# Patient Record
Sex: Female | Born: 1985 | Race: Black or African American | Hispanic: No | Smoking: Never smoker
Health system: Southern US, Community
[De-identification: ages and names within clinical notes are randomized; demographics above are authoritative.]

## PROBLEM LIST (undated history)

## (undated) DIAGNOSIS — R42 Dizziness and giddiness: Secondary | ICD-10-CM

---

## 2010-05-28 ENCOUNTER — Emergency Department (HOSPITAL_COMMUNITY): Admission: EM | Admit: 2010-05-28 | Discharge: 2010-05-28 | Payer: Self-pay | Admitting: Emergency Medicine

## 2015-08-20 ENCOUNTER — Emergency Department: Payer: BLUE CROSS/BLUE SHIELD

## 2015-08-20 ENCOUNTER — Emergency Department
Admission: EM | Admit: 2015-08-20 | Discharge: 2015-08-20 | Disposition: A | Discharge status: Home / Self Care | Payer: BLUE CROSS/BLUE SHIELD | Attending: Emergency Medicine | Admitting: Emergency Medicine

## 2015-08-20 ENCOUNTER — Encounter: Payer: Self-pay | Admitting: Medical Oncology

## 2015-08-20 DIAGNOSIS — Z3A01 Less than 8 weeks gestation of pregnancy: Secondary | ICD-10-CM | POA: Diagnosis not present

## 2015-08-20 DIAGNOSIS — O9989 Other specified diseases and conditions complicating pregnancy, childbirth and the puerperium: Secondary | ICD-10-CM | POA: Diagnosis present

## 2015-08-20 DIAGNOSIS — Z3490 Encounter for supervision of normal pregnancy, unspecified, unspecified trimester: Secondary | ICD-10-CM

## 2015-08-20 DIAGNOSIS — N760 Acute vaginitis: Secondary | ICD-10-CM

## 2015-08-20 DIAGNOSIS — M545 Low back pain, unspecified: Secondary | ICD-10-CM

## 2015-08-20 DIAGNOSIS — O23591 Infection of other part of genital tract in pregnancy, first trimester: Secondary | ICD-10-CM | POA: Insufficient documentation

## 2015-08-20 DIAGNOSIS — B9689 Other specified bacterial agents as the cause of diseases classified elsewhere: Secondary | ICD-10-CM

## 2015-08-20 DIAGNOSIS — R102 Pelvic and perineal pain: Secondary | ICD-10-CM | POA: Diagnosis not present

## 2015-08-20 HISTORY — DX: Dizziness and giddiness: R42

## 2015-08-20 LAB — CBC
HEMATOCRIT: 35.3 % (ref 35.0–47.0)
Hemoglobin: 11.9 g/dL — ABNORMAL LOW (ref 12.0–16.0)
MCH: 29.7 pg (ref 26.0–34.0)
MCHC: 33.8 g/dL (ref 32.0–36.0)
MCV: 87.9 fL (ref 80.0–100.0)
PLATELETS: 336 10*3/uL (ref 150–440)
RBC: 4.01 MIL/uL (ref 3.80–5.20)
RDW: 13.2 % (ref 11.5–14.5)
WBC: 13.9 10*3/uL — AB (ref 3.6–11.0)

## 2015-08-20 LAB — URINALYSIS COMPLETE WITH MICROSCOPIC (ARMC ONLY)
BILIRUBIN URINE: NEGATIVE
Bacteria, UA: NONE SEEN
GLUCOSE, UA: NEGATIVE mg/dL
Hgb urine dipstick: NEGATIVE
KETONES UR: NEGATIVE mg/dL
Leukocytes, UA: NEGATIVE
Nitrite: NEGATIVE
PROTEIN: NEGATIVE mg/dL
Specific Gravity, Urine: 1.01 (ref 1.005–1.030)
pH: 7 (ref 5.0–8.0)

## 2015-08-20 LAB — WET PREP, GENITAL
Sperm: NONE SEEN
Trich, Wet Prep: NONE SEEN
Yeast Wet Prep HPF POC: NONE SEEN

## 2015-08-20 LAB — HCG, QUANTITATIVE, PREGNANCY: HCG, BETA CHAIN, QUANT, S: 4351 m[IU]/mL — AB (ref <5)

## 2015-08-20 LAB — LIPASE, BLOOD: LIPASE: 31 U/L (ref 11–51)

## 2015-08-20 LAB — COMPREHENSIVE METABOLIC PANEL
ALT: 20 U/L (ref 14–54)
AST: 20 U/L (ref 15–41)
Albumin: 4 g/dL (ref 3.5–5.0)
Alkaline Phosphatase: 53 U/L (ref 38–126)
Anion gap: 8 (ref 5–15)
BUN: 9 mg/dL (ref 6–20)
CHLORIDE: 105 mmol/L (ref 101–111)
CO2: 24 mmol/L (ref 22–32)
CREATININE: 0.83 mg/dL (ref 0.44–1.00)
Calcium: 9.3 mg/dL (ref 8.9–10.3)
Glucose, Bld: 105 mg/dL — ABNORMAL HIGH (ref 65–99)
POTASSIUM: 3.6 mmol/L (ref 3.5–5.1)
SODIUM: 137 mmol/L (ref 135–145)
Total Bilirubin: 0.4 mg/dL (ref 0.3–1.2)
Total Protein: 7.3 g/dL (ref 6.5–8.1)

## 2015-08-20 LAB — CHLAMYDIA/NGC RT PCR (ARMC ONLY)
CHLAMYDIA TR: NOT DETECTED
N gonorrhoeae: NOT DETECTED

## 2015-08-20 MED ORDER — ACETAMINOPHEN 325 MG PO TABS
650.0000 mg | ORAL_TABLET | Freq: Once | ORAL | Status: AC
  Administered 2015-08-20: 650 mg via ORAL

## 2015-08-20 MED ORDER — METRONIDAZOLE 500 MG PO TABS: 500.0000 mg | ORAL_TABLET | Freq: Two times a day (BID) | ORAL | Status: AC

## 2015-08-20 NOTE — Discharge Instructions (Signed)
Please take the entire course of Flagyl, even if you're feeling better. You may take Tylenol for pain.   Please make an appointment with your OB/GYN for Monday or Tuesday of next week. Please return to the emergency department if you develop severe pain, vaginal bleeding, lightheadedness or fainting, fever, inability to keep down fluids, or any other symptoms concerning to you.  Bacterial Vaginosis Bacterial vaginosis is an infection of the vagina. It happens when too many germs (bacteria) grow in the vagina. Having this infection puts you at risk for getting other infections from sex. Treating this infection can help lower your risk for other infections, such as:   Chlamydia.  Gonorrhea.  HIV.  Herpes. HOME CARE  Take your medicine as told by your doctor.  Finish your medicine even if you start to feel better.  Tell your sex partner that you have an infection. They should see their doctor for treatment.  During treatment:  Avoid sex or use condoms correctly.  Do not douche.  Do not drink alcohol unless your doctor tells you it is ok.  Do not breastfeed unless your doctor tells you it is ok. GET HELP IF:  You are not getting better after 3 days of treatment.  You have more grey fluid (discharge) coming from your vagina than before.  You have more pain than before.  You have a fever. MAKE SURE YOU:   Understand these instructions.  Will watch your condition.  Will get help right away if you are not doing well or get worse.   This information is not intended to replace advice given to you by your health care provider. Make sure you discuss any questions you have with your health care provider.   Document Released: 06/13/2008 Document Revised: 09/25/2014 Document Reviewed: 04/16/2013 Elsevier Interactive Patient Education [employer] Inc.

## 2015-08-20 NOTE — ED Notes (Signed)
Pt reports that she began having lower back pain and lower abd cramping today. Pt reports she is approx [redacted] weeks pregnant. Denies vaginal bleeding.

## 2015-08-20 NOTE — ED Provider Notes (Addendum)
[facility] Emergency Department Provider Note  ____________________________________________  Time seen: Approximately 8:12 PM  I have reviewed the triage vital signs and the nursing notes.   HISTORY  Chief Complaint Abdominal Cramping and Back Pain    HPI Lori Nash is a 29 y.o. female G2 P1 approximately [redacted] weeks pregnant by LMP presenting with low back discomfort and pelvic pain. Patient states that earlier today she started having some cramping sensation in her low back with suprapubic pain. She has had urinary frequency but no pain with urination. She denies any vaginal bleeding but has had a change in her vaginal discharge. She denies any fever, chills, diarrhea. She has had some nausea without vomiting. She has not yet established OB care as she is early in the pregnancy.   Past Medical History  Diagnosis Date  . Vertigo     There are no active problems to display for this patient.   Past Surgical History  Procedure Laterality Date  . Cesarean section      Current Outpatient Rx  Name  Route  Sig  Dispense  Refill  . metroNIDAZOLE (FLAGYL) 500 MG tablet   Oral   Take 1 tablet (500 mg total) by mouth 2 (two) times daily.   14 tablet   0     Allergies Review of patient's allergies indicates no known allergies.  No family history on file.  Social History Social History  Substance Use Topics  . Smoking status: Never Smoker   . Smokeless tobacco: None  . Alcohol Use: No    Review of Systems Constitutional: No fever/chills. No lightheadedness or syncope. Eyes: No visual changes. ENT: No sore throat. Cardiovascular: Denies chest pain, palpitations. Respiratory: Denies shortness of breath.  No cough. Gastrointestinal: No abdominal pain.  Positive low back pain and pelvic pain. No nausea, no vomiting.  No diarrhea.  No constipation. Genitourinary: Negative for dysuria. No vaginal bleeding. Positive for changes in vaginal  discharge. Musculoskeletal: Negative for back pain. Skin: Negative for rash. Neurological: Negative for headaches, focal weakness or numbness.  10-point ROS otherwise negative.  ____________________________________________   PHYSICAL EXAM:  VITAL SIGNS: ED Triage Vitals  Enc Vitals Group     BP 08/20/15 1856 133/76 mmHg     Pulse Rate 08/20/15 1856 90     Resp 08/20/15 1856 18     Temp 08/20/15 1856 98.4 F (36.9 C)     Temp Source 08/20/15 1856 Oral     SpO2 08/20/15 1856 100 %     Weight 08/20/15 1856 270 lb (122.471 kg)     Height 08/20/15 1856  (1.727 m)     Head Cir --      Peak Flow --      Pain Score 08/20/15 1857 4     Pain Loc --      Pain Edu? --      Excl. in GC? --     Constitutional: Alert and oriented. Well appearing and in no acute distress. Answer question appropriately. Lying comfortably in the stretcher. Eyes: Conjunctivae are normal.  EOMI. Head: Atraumatic. Nose: No congestion/rhinnorhea. Mouth/Throat: Mucous membranes are moist.  Neck: No stridor.  Supple.   Cardiovascular: Normal rate, regular rhythm. No murmurs, rubs or gallops.  Respiratory: Normal respiratory effort.  No retractions. Lungs CTAB.  No wheezes, rales or ronchi. Gastrointestinal: Abdomen is soft and nondistended. Patient has minimal diffuse tenderness in the lower abdomen without focality. No palpable uterus. No guarding or rebound, no peritoneal  signs, negative Murphy sign. Genitourinary: Normal-appearing external genitalia without lesions. Speculum exam reveals a mild amount of thick white vaginal discharge with a normal-appearing cervix. Bimanual exam reveals no CMT, closed cervix, no adnexal masses or tenderness, "pressure" over the suprapubic area and no palpable uterine fundus. Exam may be limited due to patient's large body habitus. Musculoskeletal: No LE edema.  Neurologic:  Normal speech and language. No gross focal neurologic deficits are appreciated.  Skin:  Skin is  warm, dry and intact. No rash noted. Psychiatric: Mood and affect are normal. Speech and behavior are normal.  Normal judgement.  ____________________________________________   LABS (all labs ordered are listed, but only abnormal results are displayed)  Labs Reviewed  WET PREP, GENITAL - Abnormal; Notable for the following:    Clue Cells Wet Prep HPF POC FEW (*)    WBC, Wet Prep HPF POC FEW (*)    All other components within normal limits  COMPREHENSIVE METABOLIC PANEL - Abnormal; Notable for the following:    Glucose, Bld 105 (*)    All other components within normal limits  CBC - Abnormal; Notable for the following:    WBC 13.9 (*)    Hemoglobin 11.9 (*)    All other components within normal limits  HCG, QUANTITATIVE, PREGNANCY - Abnormal; Notable for the following:    hCG, Beta Chain, Quant, S 4351 (*)    All other components within normal limits  URINALYSIS COMPLETEWITH MICROSCOPIC ([facility] ONLY) - Abnormal; Notable for the following:    Color, Urine YELLOW (*)    APPearance CLEAR (*)    Squamous Epithelial / LPF 0-5 (*)    All other components within normal limits  CHLAMYDIA/NGC RT PCR ([facility] ONLY)  LIPASE, BLOOD   ____________________________________________  EKG  Not indicated ____________________________________________  RADIOLOGY  US Ob Comp Less 14 Wks  08/20/2015  CLINICAL DATA:  Lower abdominal cramping, onset today. EXAM: OBSTETRIC <14 WK [place] AND TRANSVAGINAL OB US TECHNIQUE: Both transabdominal and transvaginal ultrasound examinations were performed for complete evaluation of the gestation as well as the maternal uterus, adnexal regions, and pelvic cul-de-sac. Transvaginal technique was performed to assess early pregnancy. COMPARISON:  None. FINDINGS: Intrauterine gestational sac: Single Yolk sac:  Yes Embryo:  No Cardiac Activity: No Heart Rate:   bpm MSD: 7  mm   5 w   3  d CRL:    mm    w    d                  [place] EDC: Maternal uterus/adnexae: Normal  IMPRESSION: Early intrauterine gestational sac with visible yolk sac, but no fetal pole or cardiac activity yet visualized. Recommend follow-up quantitative B-HCG levels and follow-up US in 14 days to confirm and assess viability. This recommendation follows SRU consensus guidelines: Diagnostic Criteria for Nonviable Pregnancy Early in the First Trimester. Malva Limes Med 2013; 098:1191-47. Electronically Signed   By: Ellery Plunk M.D.   On: 08/20/2015 22:56   US Ob Transvaginal  08/20/2015  CLINICAL DATA:  Lower abdominal cramping, onset today. EXAM: OBSTETRIC <14 WK [place] AND TRANSVAGINAL OB US TECHNIQUE: Both transabdominal and transvaginal ultrasound examinations were performed for complete evaluation of the gestation as well as the maternal uterus, adnexal regions, and pelvic cul-de-sac. Transvaginal technique was performed to assess early pregnancy. COMPARISON:  None. FINDINGS: Intrauterine gestational sac: Single Yolk sac:  Yes Embryo:  No Cardiac Activity: No Heart Rate:   bpm MSD: 7  mm   5  w   3  d CRL:    mm    w    d                  US EDC: Maternal uterus/adnexae: Normal IMPRESSION: Early intrauterine gestational sac with visible yolk sac, but no fetal pole or cardiac activity yet visualized. Recommend follow-up quantitative B-HCG levels and follow-up US in 14 days to confirm and assess viability. This recommendation follows SRU consensus guidelines: Diagnostic Criteria for Nonviable Pregnancy Early in the First Trimester. Malva Limes Engl J Med 2013; 147:8295-62; 369:1443-51. Electronically Signed   By: Ellery Plunkaniel R Mitchell M.D.   On: 08/20/2015 22:56    ____________________________________________   PROCEDURES  Procedure(s) performed: None  Critical Care performed: No ____________________________________________   INITIAL IMPRESSION / ASSESSMENT AND PLAN / ED COURSE  Pertinent labs & imaging results that were available during my care of the patient were reviewed by me and considered in my medical decision  making (see chart for details).  29 y.o. G2 P1 apparently [redacted] weeks pregnant presenting with low back cramping and suprapubic pain with urinary frequency. We will evaluate her for ectopic pregnancy although at this early stage of pregnancy it is possible we will not have an answer about this today. Given her vaginal discharge and also check for vaginal infection. Consider UTI there is no evidence for appendicitis or an acute surgical pathology at this time.  ----------------------------------------- 11:08 PM on 08/20/2015 -----------------------------------------  The patient's wet prep came back positive for bacterial vaginosis. I have let the patient now in particular prescription for her Flagyl. The patient's ultrasound does show yolk sac but no fetal pole. I'll plan discharge with close OB/GYN follow-up. The patient and her husband understand return precautions as well as follow-up instructions.  NEW MEDICATIONS STARTED DURING THIS VISIT:  New Prescriptions   METRONIDAZOLE (FLAGYL) 500 MG TABLET    Take 1 tablet (500 mg total) by mouth 2 (two) times daily.    ____________________________________________  FINAL CLINICAL IMPRESSION(S) / ED DIAGNOSES  Final diagnoses:  Bacterial vaginosis      NEW MEDICATIONS STARTED DURING THIS VISIT:  New Prescriptions   METRONIDAZOLE (FLAGYL) 500 MG TABLET    Take 1 tablet (500 mg total) by mouth 2 (two) times daily.     Rockne MenghiniAnne-Caroline Fareedah Mahler, MD 08/20/15 [id number]  Rockne MenghiniAnne-Caroline Oluwatoyin Banales, MD 08/20/15 2310

## 2015-09-26 ENCOUNTER — Emergency Department
Admission: EM | Admit: 2015-09-26 | Discharge: 2015-09-26 | Disposition: A | Discharge status: Home / Self Care | Payer: BLUE CROSS/BLUE SHIELD | Attending: Emergency Medicine | Admitting: Emergency Medicine

## 2015-09-26 ENCOUNTER — Emergency Department: Payer: BLUE CROSS/BLUE SHIELD

## 2015-09-26 DIAGNOSIS — O200 Threatened abortion: Secondary | ICD-10-CM

## 2015-09-26 DIAGNOSIS — O2341 Unspecified infection of urinary tract in pregnancy, first trimester: Secondary | ICD-10-CM | POA: Diagnosis not present

## 2015-09-26 DIAGNOSIS — Z3A10 10 weeks gestation of pregnancy: Secondary | ICD-10-CM | POA: Diagnosis not present

## 2015-09-26 DIAGNOSIS — O209 Hemorrhage in early pregnancy, unspecified: Secondary | ICD-10-CM | POA: Diagnosis present

## 2015-09-26 DIAGNOSIS — Z79899 Other long term (current) drug therapy: Secondary | ICD-10-CM | POA: Insufficient documentation

## 2015-09-26 DIAGNOSIS — O039 Complete or unspecified spontaneous abortion without complication: Secondary | ICD-10-CM | POA: Insufficient documentation

## 2015-09-26 LAB — URINALYSIS COMPLETE WITH MICROSCOPIC (ARMC ONLY)
Bilirubin Urine: NEGATIVE
Glucose, UA: NEGATIVE mg/dL
KETONES UR: NEGATIVE mg/dL
Nitrite: NEGATIVE
PROTEIN: 30 mg/dL — AB
SPECIFIC GRAVITY, URINE: 1.023 (ref 1.005–1.030)
pH: 6 (ref 5.0–8.0)

## 2015-09-26 LAB — HCG, QUANTITATIVE, PREGNANCY: HCG, BETA CHAIN, QUANT, S: 216394 m[IU]/mL — AB (ref <5)

## 2015-09-26 LAB — POCT PREGNANCY, URINE: PREG TEST UR: POSITIVE — AB

## 2015-09-26 LAB — ABO/RH: ABO/RH(D): B POS

## 2015-09-26 MED ORDER — NITROFURANTOIN MONOHYD MACRO 100 MG PO CAPS: 100.0000 mg | ORAL_CAPSULE | Freq: Two times a day (BID) | ORAL | Status: AC

## 2015-09-26 NOTE — ED Notes (Signed)
Pt says she got up to use the bathroom this am and noticed strawberry red vaginal bleeding; no clots; pt is [redacted] weeks pregnant with second child; denies abd pain/cramping;

## 2015-09-26 NOTE — ED Provider Notes (Signed)
Patient well-appearing and has no new complaints. Ultrasound unremarkable. Patient [facility]. Return precautions discussed  Jene Everyobert Precious Gilchrest, MD 09/26/15 [phone]

## 2015-09-26 NOTE — ED Notes (Signed)
Pt informed to rest but does not need to be on bedrest. Pt agreeable with plan. Alert and oriented x 4. Ambulatory to lobby. Denies any needs at this time.

## 2015-09-26 NOTE — ED Notes (Signed)
Pt in ultrasound. Visitor sitting in room.

## 2015-09-26 NOTE — ED Provider Notes (Signed)
[facility] Emergency Department Provider Note  ____________________________________________  Time seen: Approximately 5:42 AM  I have reviewed the triage vital signs and the nursing notes.   HISTORY  Chief Complaint Vaginal Bleeding    HPI Lori Nash is a 30 y.o. female G2 P1 at [redacted] weeks gestation followed at [facility].  She presents today after getting up this morning to urinate and when she wiped she had some bright red blood on the tissue paper.  She has had no large volume of bleeding and denies any abdominal pain or cramping.  The onset of the bleeding was acute and the severity is mild.  Nothing is made it worse.  She has no nausea or vomiting.  She has no dysuria.  She has not had any intercourse recently or other pelvic trauma that might have caused the bleeding.  She sees no blood in her urine.  She has no history of complications with this pregnancy and no prior spontaneous or elective abortions.   Past Medical History  Diagnosis Date  . Vertigo     There are no active problems to display for this patient.   Past Surgical History  Procedure Laterality Date  . Cesarean section      Current Outpatient Rx  Name  Route  Sig  Dispense  Refill  . Prenatal Vit-Fe Fumarate-FA (MULTIVITAMIN-PRENATAL) 27-0.8 MG TABS tablet   Oral   Take 1 tablet by mouth daily at 12 noon.         . nitrofurantoin, macrocrystal-monohydrate, (MACROBID) 100 MG capsule   Oral   Take 1 capsule (100 mg total) by mouth 2 (two) times daily.   14 capsule   0     Allergies Review of patient's allergies indicates no known allergies.  No family history on file.  Social History Social History  Substance Use Topics  . Smoking status: Never Smoker   . Smokeless tobacco: Not on file  . Alcohol Use: No    Review of Systems Constitutional: No fever/chills Eyes: No visual changes. ENT: No sore throat. Cardiovascular: Denies chest pain. Respiratory: Denies  shortness of breath. Gastrointestinal: No abdominal pain.  No nausea, no vomiting.  No diarrhea.  No constipation. Genitourinary: Negative for dysuria.  BRB on toilet paper when wiping after urination. Musculoskeletal: Negative for back pain. Skin: Negative for rash. Neurological: Negative for headaches, focal weakness or numbness.  10-point ROS otherwise negative.  ____________________________________________   PHYSICAL EXAM:  VITAL SIGNS: ED Triage Vitals  Enc Vitals Group     BP 09/26/15 0523 125/72 mmHg     Pulse Rate 09/26/15 0523 94     Resp 09/26/15 0523 18     Temp 09/26/15 0523 98.4 F (36.9 C)     Temp Source 09/26/15 0523 Oral     SpO2 09/26/15 0523 98 %     Weight 09/26/15 0523 272 lb (123.378 kg)     Height 09/26/15 0523 5\' 8"  (1.727 m)     Head Cir --      Peak Flow --      Pain Score 09/26/15 0524 0     Pain Loc --      Pain Edu? --      Excl. in GC? --     Constitutional: Alert and oriented. Well appearing and in no acute distress. Eyes: Conjunctivae are normal. PERRL. EOMI. Head: Atraumatic. Nose: No congestion/rhinnorhea. Mouth/Throat: Mucous membranes are moist.  Oropharynx non-erythematous. Neck: No stridor.   Cardiovascular: Normal rate, regular  rhythm. Grossly normal heart sounds.  Good peripheral circulation. Respiratory: Normal respiratory effort.  No retractions. Lungs CTAB. Gastrointestinal: Soft and nontender. No distention. No abdominal bruits. No CVA tenderness. Genitourinary: Deferred Musculoskeletal: No lower extremity tenderness nor edema.  No joint effusions. Neurologic:  Normal speech and language. No gross focal neurologic deficits are appreciated.  Skin:  Skin is warm, dry and intact. No rash noted. Psychiatric: Mood and affect are normal. Speech and behavior are normal.  ____________________________________________   LABS (all labs ordered are listed, but only abnormal results are displayed)  Labs Reviewed  HCG, QUANTITATIVE,  PREGNANCY - Abnormal; Notable for the following:    hCG, Beta Francene FindersChain, Quant, S [id number] (*)    All other components within normal limits  URINALYSIS COMPLETEWITH MICROSCOPIC ([facility] ONLY) - Abnormal; Notable for the following:    Color, Urine YELLOW (*)    APPearance CLOUDY (*)    Hgb urine dipstick 3+ (*)    Protein, ur 30 (*)    Leukocytes, UA 2+ (*)    Bacteria, UA FEW (*)    Squamous Epithelial / LPF TOO NUMEROUS TO COUNT (*)    All other components within normal limits  POCT PREGNANCY, URINE - Abnormal; Notable for the following:    Preg Test, Ur POSITIVE (*)    All other components within normal limits  URINE CULTURE  ABO/RH   ____________________________________________  EKG  Not indicated ____________________________________________  RADIOLOGY   No results found.  ____________________________________________   PROCEDURES  Procedure(s) performed: None  Critical Care performed: No ____________________________________________   INITIAL IMPRESSION / ASSESSMENT AND PLAN / ED COURSE  Pertinent labs & imaging results that were available during my care of the patient were reviewed by me and considered in my medical decision making (see chart for details).  The patient had a very small amount of blood on the tissue paper which I will workup as threatened abortion.  I explained to her that there is very little benefit at this moment to performing a pelvic exam and that an ultrasound will likely revealed information we need.  She agrees with the plan to defer the pelvic exam at this time.  ABO/Rh is pending as is urinalysis.  ----------------------------------------- 6:36 AM on 09/26/2015 -----------------------------------------  UA notable for heavy squamous epithelials, 2+ leukocytes, WBC 6-30.  I suspect contamination, but will send urine culture and treat empirically since the patient is pregnant.  HCG still pending, then will obtain TVUS.  ABO/Rh = B+, does not  need Rhogam.  ----------------------------------------- 7:06 AM on 09/26/2015 -----------------------------------------  Transferring care to Dr. Cyril LoosenKinner to follow up U/S and dispo appropriately.   ____________________________________________  FINAL CLINICAL IMPRESSION(S) / ED DIAGNOSES  Final diagnoses:  Threatened miscarriage in early pregnancy  UTI (urinary tract infection) during pregnancy, first trimester      NEW MEDICATIONS STARTED DURING THIS VISIT:  New Prescriptions   NITROFURANTOIN, MACROCRYSTAL-MONOHYDRATE, (MACROBID) 100 MG CAPSULE    Take 1 capsule (100 mg total) by mouth 2 (two) times daily.     Modified Medications   No medications on file     Discontinued Medications   No medications on file     Loleta Roseory Chane Cowden, MD 09/26/15 [phone]

## 2015-09-26 NOTE — ED Notes (Signed)
Patient reports having scant red blood this morning when she wiped following using the bathroom this morning.  She reports she has had some pains in the last few days in her RUQ that feel like cramps that are localized to the area and she rates her pain 4/10.  She reports the blood was not clumpy.  She reports she has increased her water intake recently.

## 2015-09-26 NOTE — Discharge Instructions (Signed)
You have been seen in the Emergency Department (ED) for vaginal bleeding during pregnancy, which is called a threatened abortion.  Fortunately, your evaluation was reassuring, and the ultrasound showed a normal pregnancy.  Please start taking prenatal vitamins (over the counter) if you are not already doing so.  As a result of your blood type, you did not receive an injection of medication called Rhogam - please let your OB/Gyn know.  Please follow up as recommended above.  Additionally, we sent your urine for a culture and are treating you for a mild urinary tract infection.  Please take the full course of antibiotics prescribed.  You may receive a call from the lab if you need a different kind of antibiotic based on the culture that grows out over the next several days.  If you develop any other symptoms that concern you (including, but not limited to, persistent vomiting, worsening bleeding, abdominal or pelvic pain, or fever greater than 101), please return immediately to the Emergency Department.   Vaginal Bleeding During Pregnancy, First Trimester A small amount of bleeding (spotting) from the vagina is relatively common in early pregnancy. It usually stops on its own. Various things may cause bleeding or spotting in early pregnancy. Some bleeding may be related to the pregnancy, and some may not. In most cases, the bleeding is normal and is not a problem. However, bleeding can also be a sign of something serious. Be sure to tell your health care provider about any vaginal bleeding right away. Some possible causes of vaginal bleeding during the first trimester include:  Infection or inflammation of the cervix.  Growths (polyps) on the cervix.  Miscarriage or threatened miscarriage.  Pregnancy tissue has developed outside of the uterus and in a fallopian tube (tubal pregnancy).  Tiny cysts have developed in the uterus instead of pregnancy tissue (molar pregnancy). HOME CARE INSTRUCTIONS    Watch your condition for any changes. The following actions may help to lessen any discomfort you are feeling:  Follow your health care provider's instructions for limiting your activity. If your health care provider orders bed rest, you may need to stay in bed and only get up to use the bathroom. However, your health care provider may allow you to continue light activity.  If needed, make plans for someone to help with your regular activities and responsibilities while you are on bed rest.  Keep track of the number of pads you use each day, how often you change pads, and how soaked (saturated) they are. Write this down.  Do not use tampons. Do not douche.  Do not have sexual intercourse or orgasms until approved by your health care provider.  If you pass any tissue from your vagina, save the tissue so you can show it to your health care provider.  Only take over-the-counter or prescription medicines as directed by your health care provider.  Do not take aspirin because it can make you bleed.  Keep all follow-up appointments as directed by your health care provider. SEEK MEDICAL CARE IF:  You have any vaginal bleeding during any part of your pregnancy.  You have cramps or labor pains.  You have a fever, not controlled by medicine. SEEK IMMEDIATE MEDICAL CARE IF:   You have severe cramps in your back or belly (abdomen).  You pass large clots or tissue from your vagina.  Your bleeding increases.  You feel light-headed or weak, or you have fainting episodes.  You have chills.  You are leaking fluid  or have a gush of fluid from your vagina.  You pass out while having a bowel movement. MAKE SURE YOU:  Understand these instructions.  Will watch your condition.  Will get help right away if you are not doing well or get worse.   This information is not intended to replace advice given to you by your health care provider. Make sure you discuss any questions you have with  your health care provider.   Document Released: 06/14/2005 Document Revised: 09/09/2013 Document Reviewed: 05/12/2013 Elsevier Interactive Patient Education 2016 [employer]   Urinary Tract Infection Urinary tract infections (UTIs) can develop anywhere along your urinary tract. Your urinary tract is your body's drainage system for removing wastes and extra water. Your urinary tract includes two kidneys, two ureters, a bladder, and a urethra. Your kidneys are a pair of bean-shaped organs. Each kidney is about the size of your fist. They are located below your ribs, one on each side of your spine. CAUSES Infections are caused by microbes, which are microscopic organisms, including fungi, viruses, and bacteria. These organisms are so small that they can only be seen through a microscope. Bacteria are the microbes that most commonly cause UTIs. SYMPTOMS  Symptoms of UTIs may vary by age and gender of the patient and by the location of the infection. Symptoms in young women typically include a frequent and intense urge to urinate and a painful, burning feeling in the bladder or urethra during urination. Older women and men are more likely to be tired, shaky, and weak and have muscle aches and abdominal pain. A fever may mean the infection is in your kidneys. Other symptoms of a kidney infection include pain in your back or sides below the ribs, nausea, and vomiting. DIAGNOSIS To diagnose a UTI, your caregiver will ask you about your symptoms. Your caregiver will also ask you to provide a urine sample. The urine sample will be tested for bacteria and white blood cells. White blood cells are made by your body to help fight infection. TREATMENT  Typically, UTIs can be treated with medication. Because most UTIs are caused by a bacterial infection, they usually can be treated with the use of antibiotics. The choice of antibiotic and length of treatment depend on your symptoms and the type of bacteria causing  your infection. HOME CARE INSTRUCTIONS  If you were prescribed antibiotics, take them exactly as your caregiver instructs you. Finish the medication even if you feel better after you have only taken some of the medication.  Drink enough water and fluids to keep your urine clear or pale yellow.  Avoid caffeine, tea, and carbonated beverages. They tend to irritate your bladder.  Empty your bladder often. Avoid holding urine for long periods of time.  Empty your bladder before and after sexual intercourse.  After a bowel movement, women should cleanse from front to back. Use each tissue only once. SEEK MEDICAL CARE IF:   You have back pain.  You develop a fever.  Your symptoms do not begin to resolve within 3 days. SEEK IMMEDIATE MEDICAL CARE IF:   You have severe back pain or lower abdominal pain.  You develop chills.  You have nausea or vomiting.  You have continued burning or discomfort with urination. MAKE SURE YOU:   Understand these instructions.  Will watch your condition.  Will get help right away if you are not doing well or get worse.   This information is not intended to replace advice given to you  by your health care provider. Make sure you discuss any questions you have with your health care provider.   Document Released: 06/14/2005 Document Revised: 05/26/2015 Document Reviewed: 10/13/2011 Elsevier Interactive Patient Education [employer].

## 2015-09-27 LAB — URINE CULTURE: SPECIAL REQUESTS: NORMAL

## 2016-09-07 IMAGING — US US OB COMP LESS 14 WK
1 series · 14 of 28 positions shown · non-contrast
Comparison: None.

CLINICAL DATA: Lower abdominal cramping, onset today.

EXAM:
OBSTETRIC <14 WK US AND TRANSVAGINAL OB US
TECHNIQUE: Both transabdominal and transvaginal ultrasound examinations were
performed for complete evaluation of the gestation as well as the
maternal uterus, adnexal regions, and pelvic cul-de-sac.
Transvaginal technique was performed to assess early pregnancy.

[Series 1: us ob comp less 14 wk · 0.27mm/px · 14 of 76 slices shown]
[im 3/76]
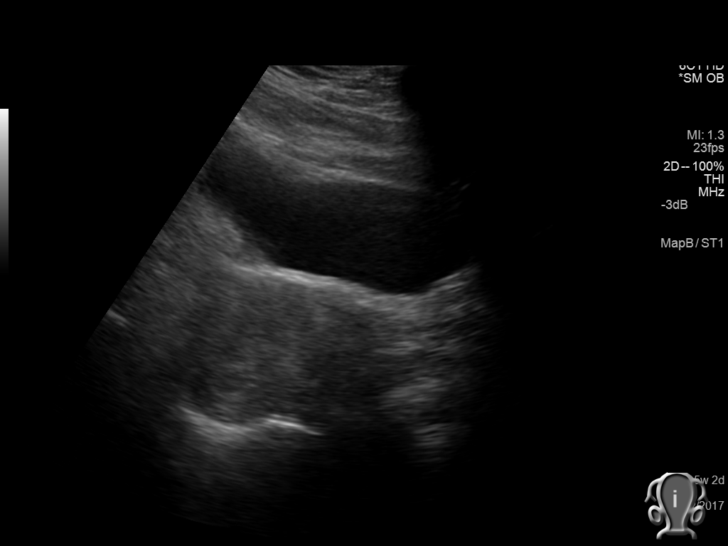
[im 9/76]
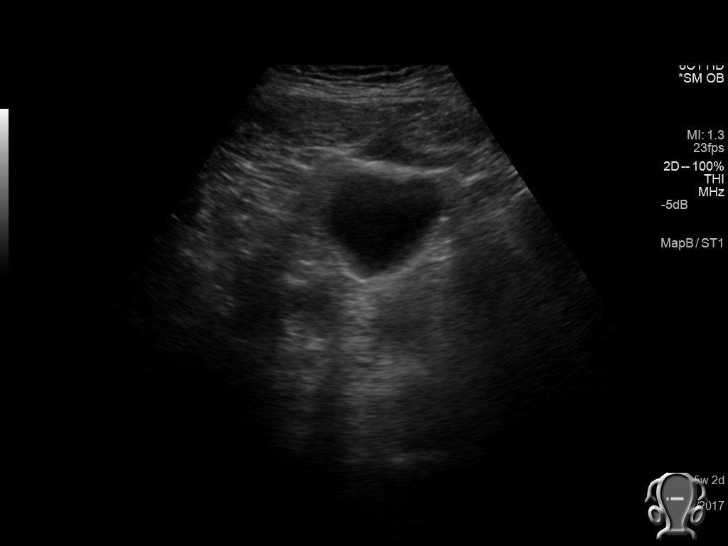
[im 14/76]
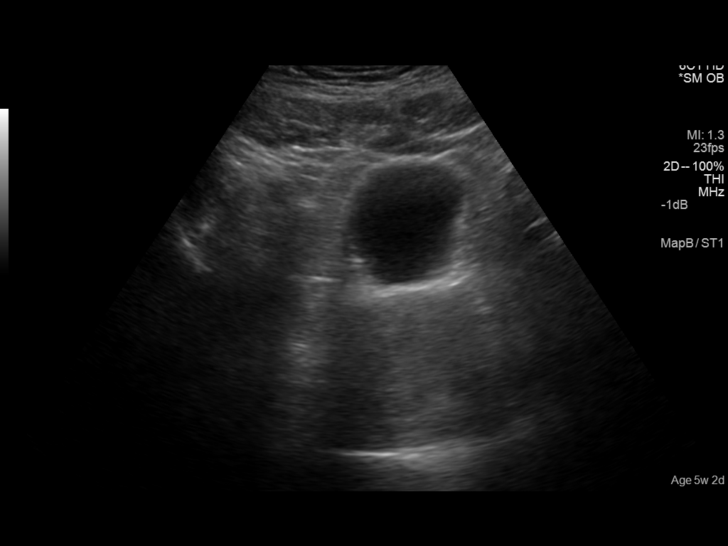
[im 20/76]
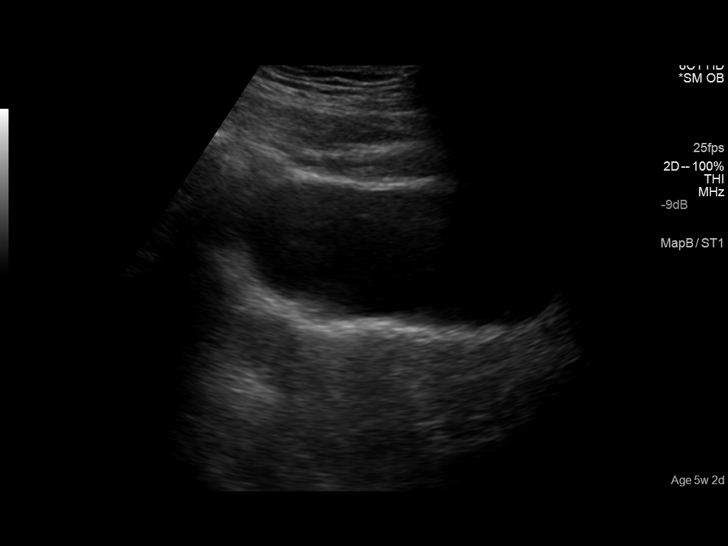
[im 26/76]
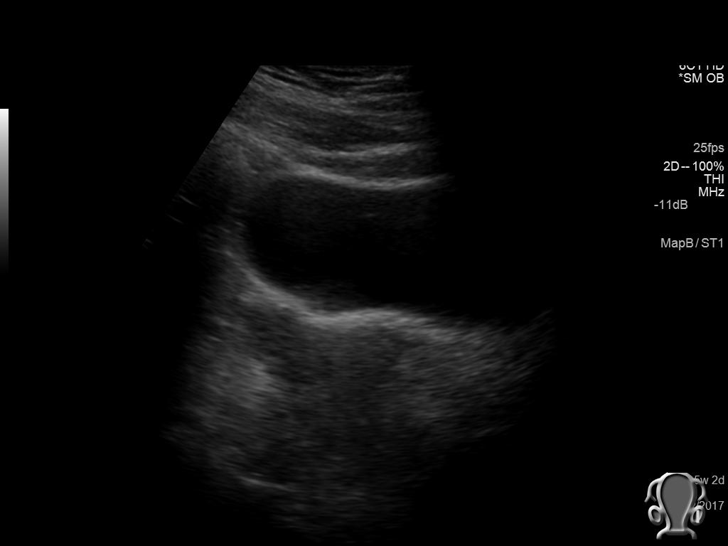
[im 31/76]
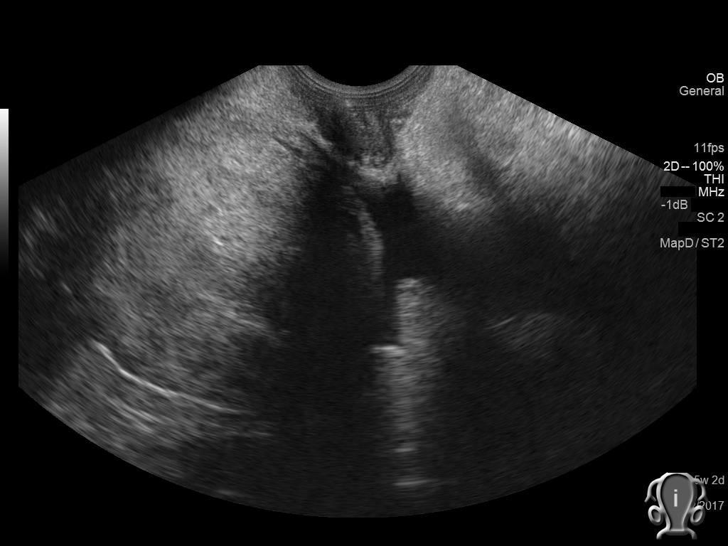
[im 37/76]
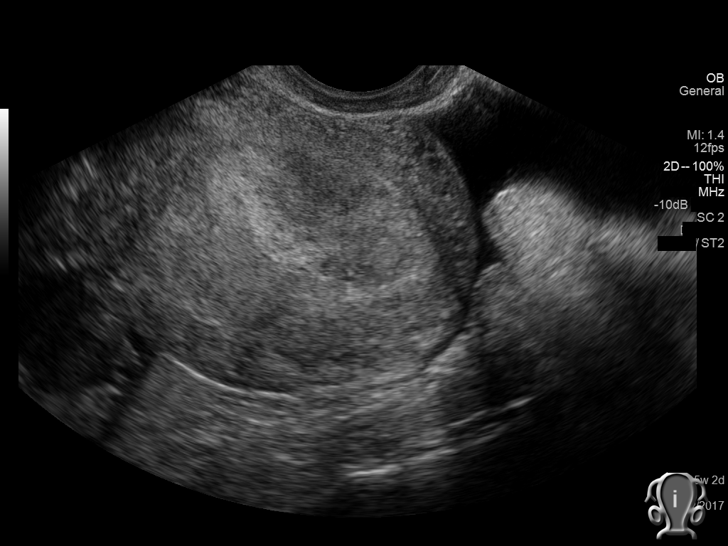
[im 42/76]
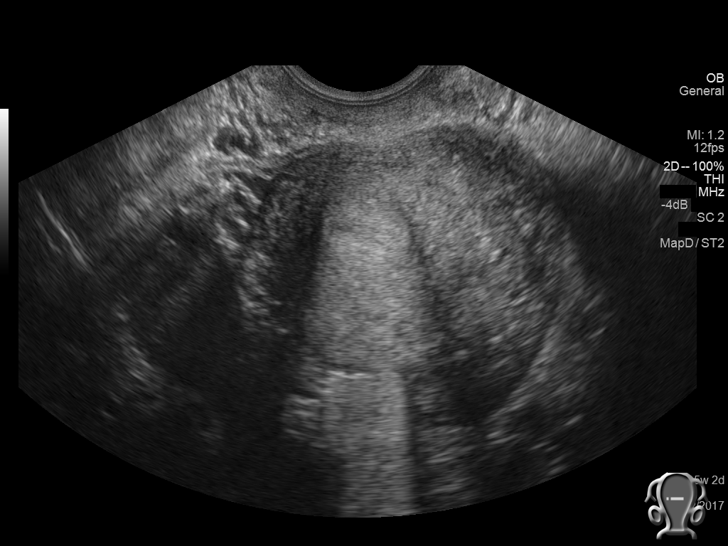
[im 48/76]
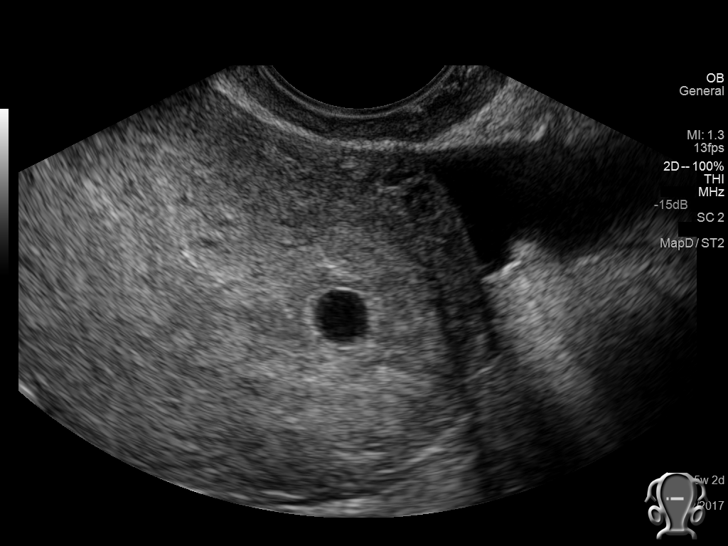
[im 53/76]
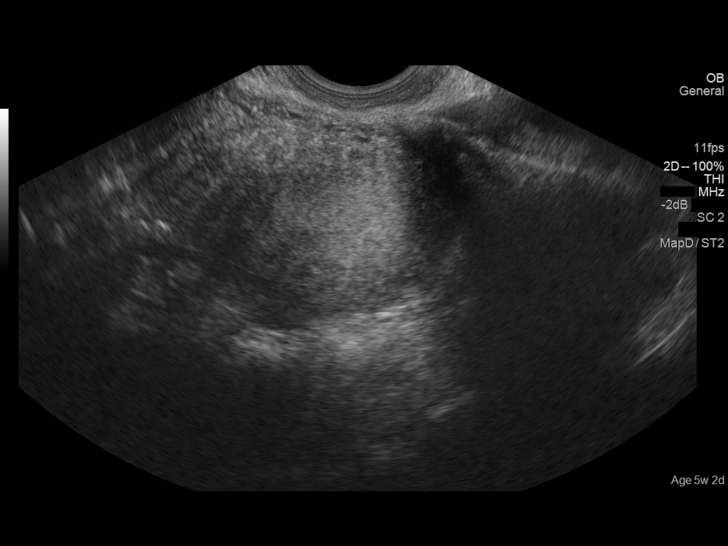
[im 59/76]
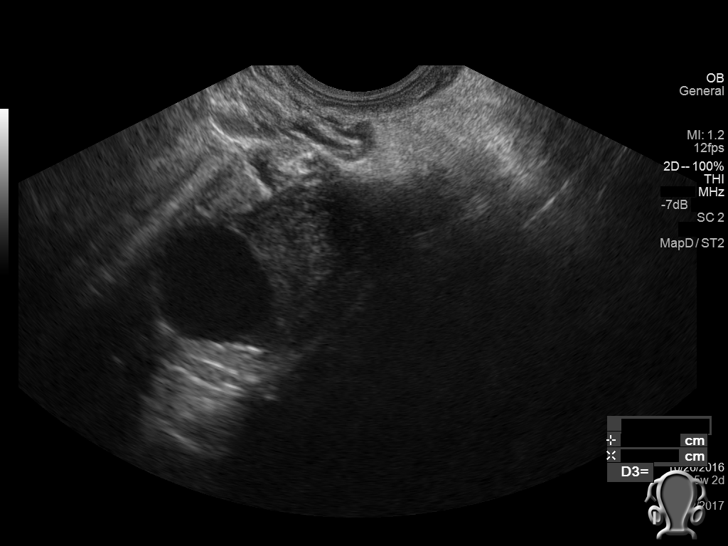
[im 64/76]
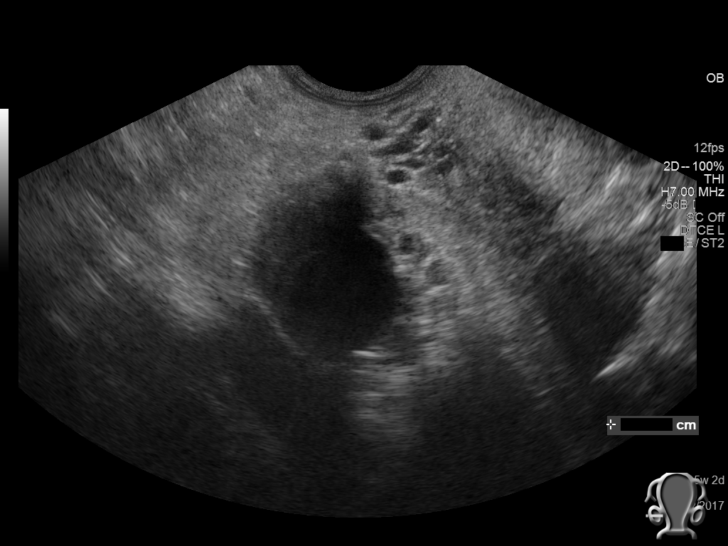
[im 70/76]
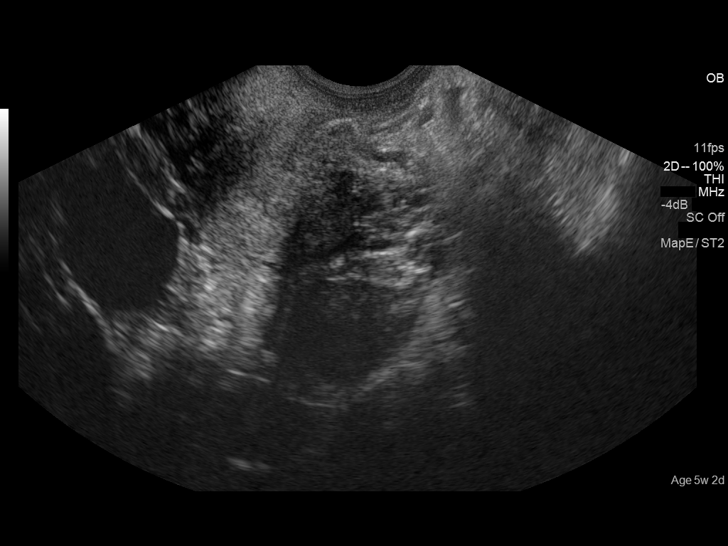
[im 76/76]
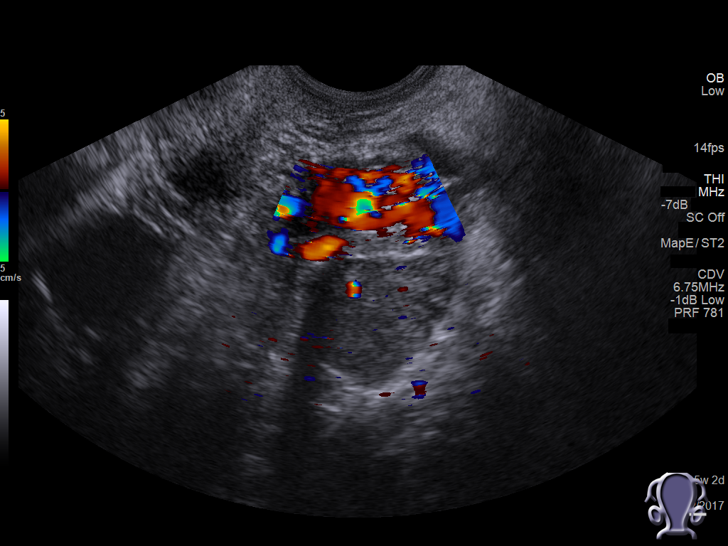

[14 of 28 positions shown; findings below may reference images not displayed]

FINDINGS: Intrauterine gestational sac: Single

Yolk sac:  Yes

Embryo:  No

Cardiac Activity: No

Heart Rate:   bpm

MSD: 7  mm   5 w   3  d

CRL:    mm    w    d                  US EDC:

Maternal uterus/adnexae: Normal
IMPRESSION: Early intrauterine gestational sac with visible yolk sac, but no
fetal pole or cardiac activity yet visualized. Recommend follow-up
quantitative B-HCG levels and follow-up US in 14 days to confirm and
assess viability. This recommendation follows SRU consensus
guidelines: Diagnostic Criteria for Nonviable Pregnancy Early in the
First Trimester. N Engl J Med 1922; [DATE].

## 2016-11-13 IMAGING — US US OB COMP LESS 14 WK
2 series · 14 of 28 positions shown · non-contrast
Comparison: Pelvic ultrasound 08/20/2015.

CLINICAL DATA: Patient with vaginal bleeding.

EXAM:
OBSTETRIC <14 WK ULTRASOUND
TECHNIQUE: Transabdominal ultrasound was performed for evaluation of the
gestation as well as the maternal uterus and adnexal regions.

[Series 1: us ob comp less 14 wk · 0.17mm/px · 13 of 45 slices shown (1 of 2)]
[im 2/45]
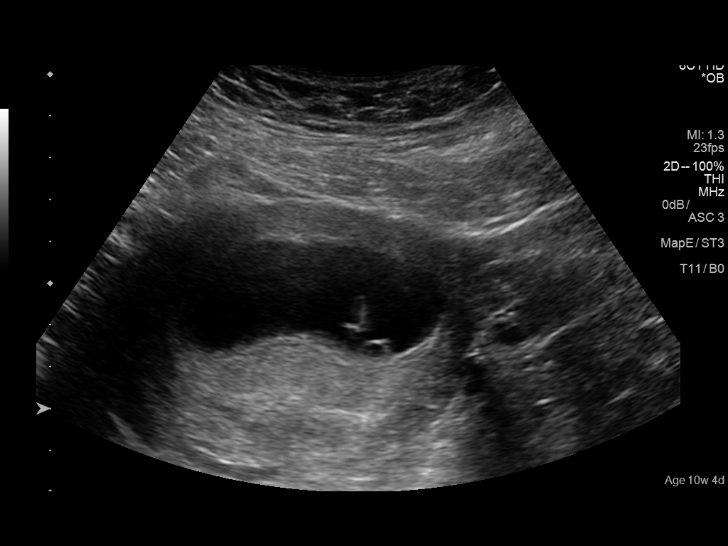
[im 6/45]
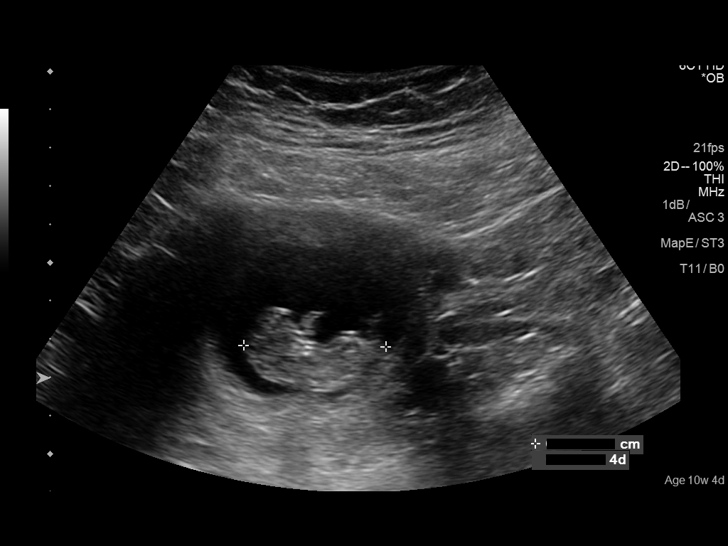
[im 9/45]
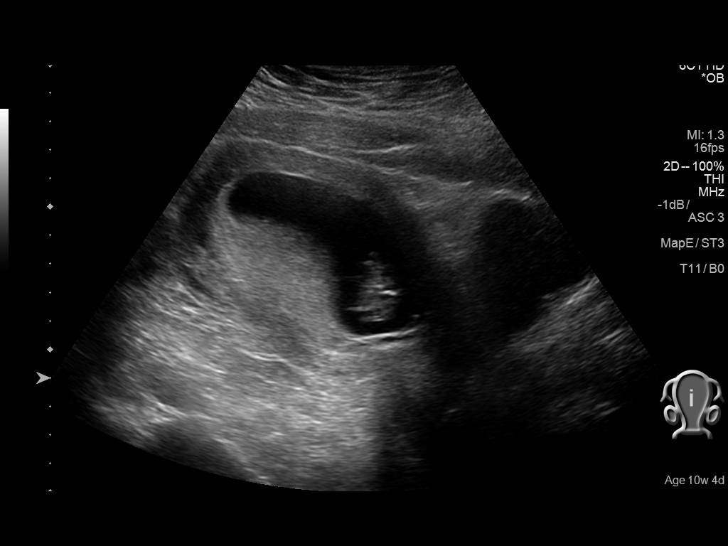
[im 12/45]
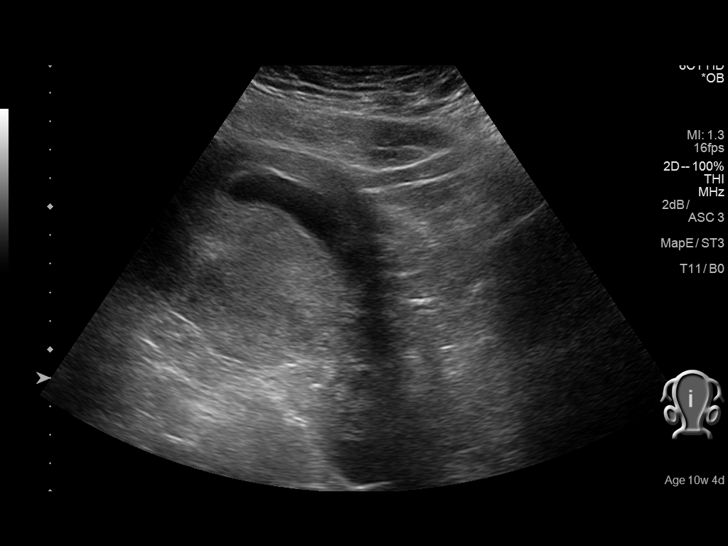
[im 16/45]
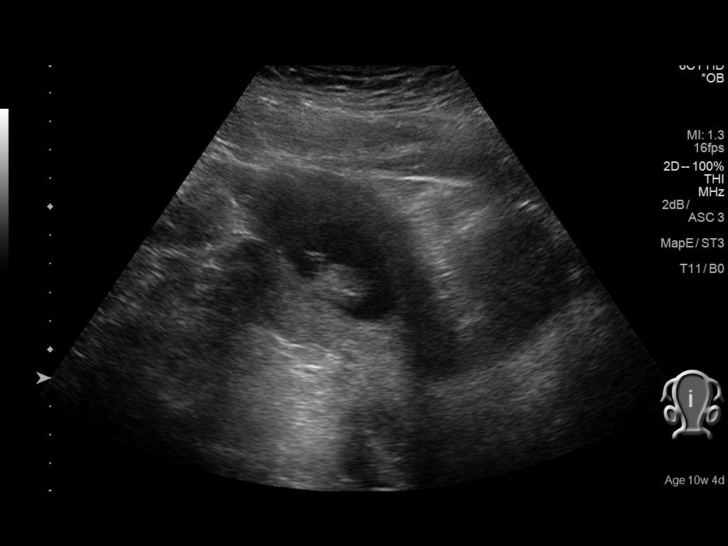
[im 19/45]
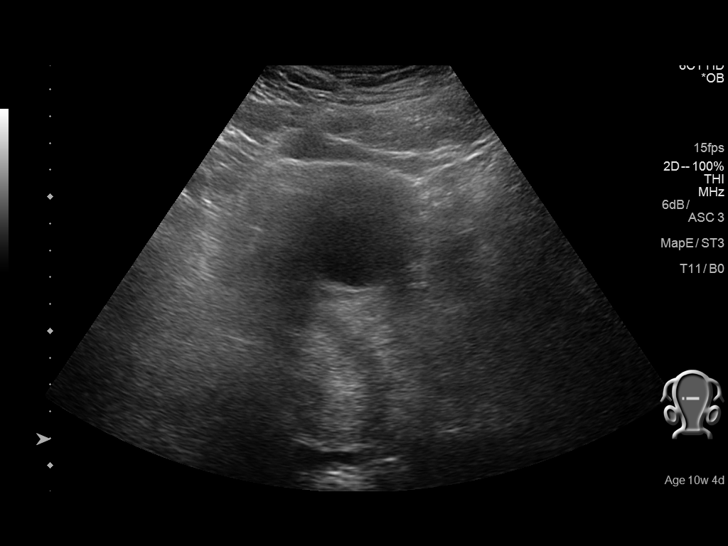
[im 23/45]
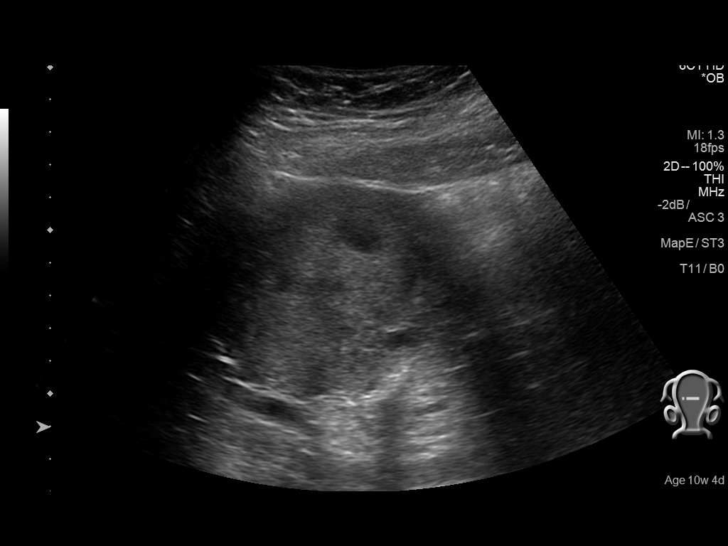
[im 26/45]
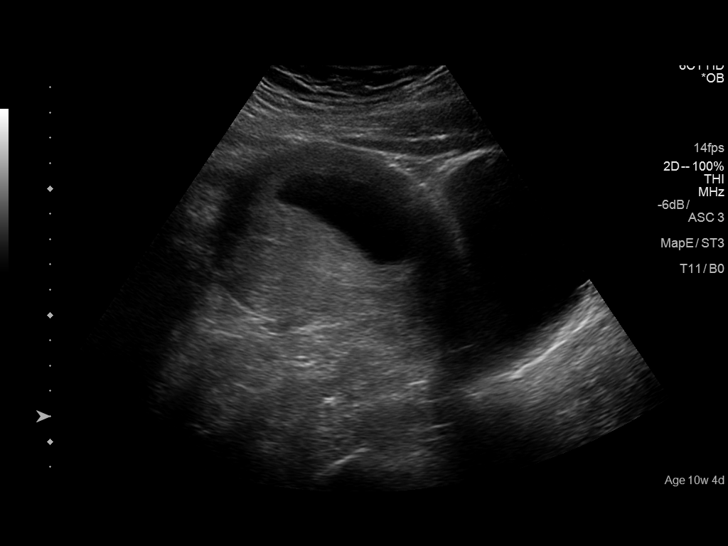
[im 29/45]
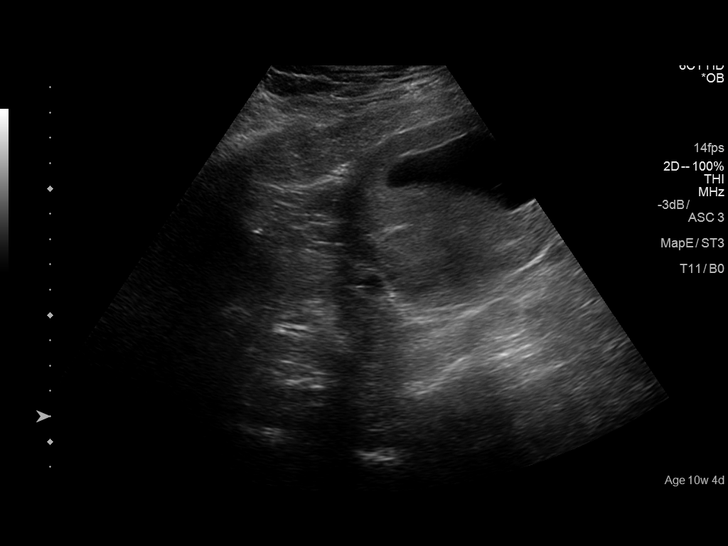
[im 33/45]
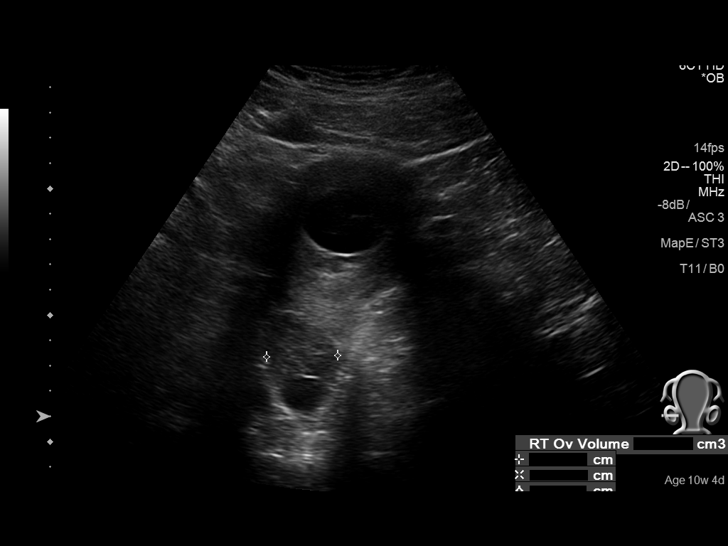
[im 36/45]
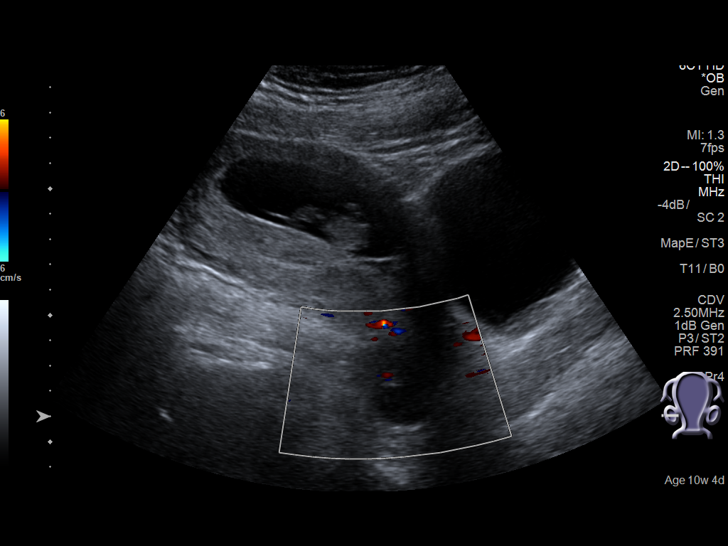
[im 39/45]
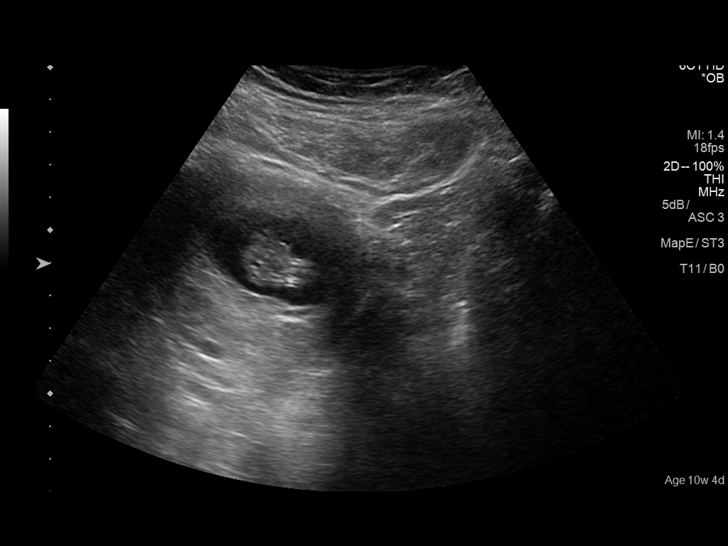
[im 43/45]
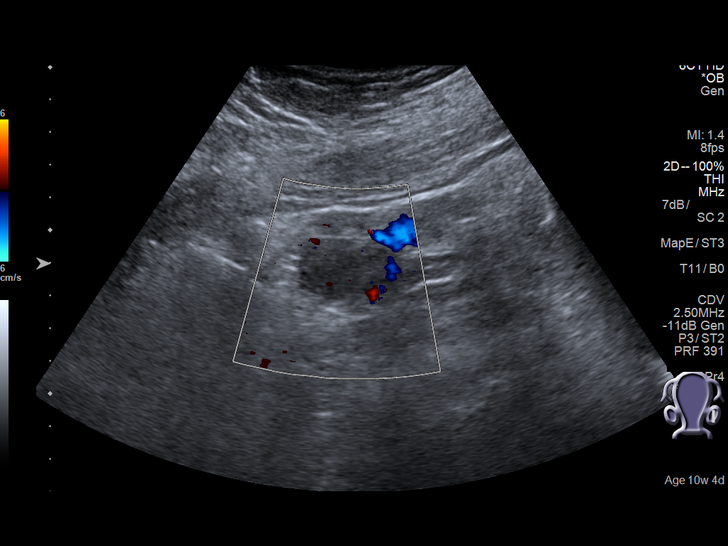

[Series 1001: us ob comp less 14 wk · 1 of 71 frames shown (2 of 2)]
[frame 36/71]
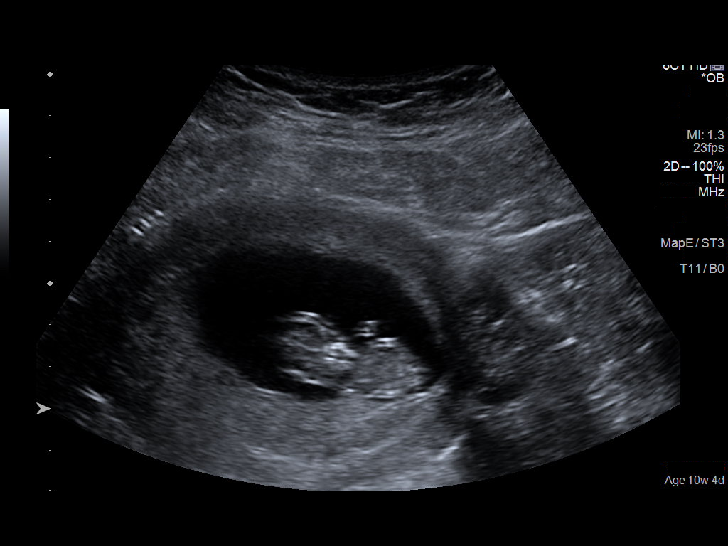

[14 of 28 positions shown; findings below may reference images not displayed]

FINDINGS: Intrauterine gestational sac: Visualized/normal in shape.

Yolk sac:  Present

Embryo:  Present

Cardiac Activity: Present

Heart Rate: 173 bpm

CRL:   37  mm   10 w 4 d                  US EDC: 04/19/2016

Subchorionic hemorrhage:  None visualized.

Maternal uterus/adnexae: Normal right and left ovaries. Trace free
fluid in the pelvis.
IMPRESSION: Single live intrauterine gestation.  No subchorionic hemorrhage.

## 2023-11-21 LAB — PANORAMA PRENATAL TEST FULL PANEL:PANORAMA TEST PLUS 5 ADDITIONAL MICRODELETIONS: FETAL FRACTION: 3.9
